# Patient Record
Sex: Female | Born: 1970 | Race: Black or African American | Hispanic: No | Smoking: Never smoker
Health system: Southern US, Community
[De-identification: ages and names within clinical notes are randomized; demographics above are authoritative.]

## PROBLEM LIST (undated history)

## (undated) DIAGNOSIS — I10 Essential (primary) hypertension: Secondary | ICD-10-CM

## (undated) DIAGNOSIS — K219 Gastro-esophageal reflux disease without esophagitis: Secondary | ICD-10-CM

## (undated) DIAGNOSIS — E785 Hyperlipidemia, unspecified: Secondary | ICD-10-CM

## (undated) HISTORY — DX: Gastro-esophageal reflux disease without esophagitis: K21.9

## (undated) HISTORY — DX: Essential (primary) hypertension: I10

## (undated) HISTORY — DX: Hyperlipidemia, unspecified: E78.5

## (undated) HISTORY — PX: BREAST BIOPSY: SHX20

---

## 1997-09-15 ENCOUNTER — Encounter: Admission: RE | Admit: 1997-09-15 | Discharge: 1997-09-15 | Payer: Self-pay | Admitting: Family Medicine

## 1997-09-24 ENCOUNTER — Ambulatory Visit (HOSPITAL_COMMUNITY): Admission: RE | Admit: 1997-09-24 | Discharge: 1997-09-24 | Payer: Self-pay | Admitting: Obstetrics & Gynecology

## 1997-09-24 ENCOUNTER — Encounter: Admission: RE | Admit: 1997-09-24 | Discharge: 1997-09-24 | Payer: Self-pay | Admitting: Family Medicine

## 1997-09-24 ENCOUNTER — Other Ambulatory Visit: Admission: RE | Admit: 1997-09-24 | Discharge: 1997-09-24 | Payer: Self-pay

## 1997-10-06 ENCOUNTER — Encounter: Admission: RE | Admit: 1997-10-06 | Discharge: 1997-10-06 | Payer: Self-pay | Admitting: Sports Medicine

## 1997-10-16 ENCOUNTER — Encounter: Admission: RE | Admit: 1997-10-16 | Discharge: 1997-10-16 | Payer: Self-pay | Admitting: Family Medicine

## 1997-11-03 ENCOUNTER — Encounter: Admission: RE | Admit: 1997-11-03 | Discharge: 1997-11-03 | Payer: Self-pay | Admitting: Family Medicine

## 1997-11-24 ENCOUNTER — Ambulatory Visit (HOSPITAL_COMMUNITY): Admission: RE | Admit: 1997-11-24 | Discharge: 1997-11-24 | Payer: Self-pay | Admitting: Family Medicine

## 1997-11-24 ENCOUNTER — Encounter: Admission: RE | Admit: 1997-11-24 | Discharge: 1997-11-24 | Payer: Self-pay | Admitting: Family Medicine

## 1997-12-08 ENCOUNTER — Encounter: Admission: RE | Admit: 1997-12-08 | Discharge: 1997-12-08 | Payer: Self-pay | Admitting: Sports Medicine

## 1997-12-23 ENCOUNTER — Ambulatory Visit (HOSPITAL_COMMUNITY): Admission: RE | Admit: 1997-12-23 | Discharge: 1997-12-23 | Payer: Self-pay | Admitting: *Deleted

## 1997-12-28 ENCOUNTER — Encounter: Admission: RE | Admit: 1997-12-28 | Discharge: 1997-12-28 | Payer: Self-pay | Admitting: Family Medicine

## 1998-01-11 ENCOUNTER — Encounter: Admission: RE | Admit: 1998-01-11 | Discharge: 1998-01-11 | Payer: Self-pay | Admitting: Family Medicine

## 1998-01-13 ENCOUNTER — Encounter: Payer: Self-pay | Admitting: *Deleted

## 1998-01-13 ENCOUNTER — Ambulatory Visit (HOSPITAL_COMMUNITY): Admission: RE | Admit: 1998-01-13 | Discharge: 1998-01-13 | Payer: Self-pay | Admitting: *Deleted

## 1998-01-14 ENCOUNTER — Encounter: Admission: RE | Admit: 1998-01-14 | Discharge: 1998-01-14 | Payer: Self-pay | Admitting: Family Medicine

## 1998-01-25 ENCOUNTER — Encounter: Admission: RE | Admit: 1998-01-25 | Discharge: 1998-01-25 | Payer: Self-pay | Admitting: Family Medicine

## 1998-02-08 ENCOUNTER — Encounter: Admission: RE | Admit: 1998-02-08 | Discharge: 1998-02-08 | Payer: Self-pay | Admitting: Family Medicine

## 1998-02-18 ENCOUNTER — Inpatient Hospital Stay (HOSPITAL_COMMUNITY): Admission: AD | Admit: 1998-02-18 | Discharge: 1998-02-18 | Payer: Self-pay | Admitting: *Deleted

## 1998-02-23 ENCOUNTER — Ambulatory Visit (HOSPITAL_COMMUNITY): Admission: RE | Admit: 1998-02-23 | Discharge: 1998-02-23 | Payer: Self-pay

## 1998-02-23 ENCOUNTER — Encounter: Admission: RE | Admit: 1998-02-23 | Discharge: 1998-02-23 | Payer: Self-pay | Admitting: Family Medicine

## 1998-03-01 ENCOUNTER — Encounter: Admission: RE | Admit: 1998-03-01 | Discharge: 1998-03-01 | Payer: Self-pay | Admitting: Family Medicine

## 1998-03-15 ENCOUNTER — Encounter: Admission: RE | Admit: 1998-03-15 | Discharge: 1998-03-15 | Payer: Self-pay | Admitting: Family Medicine

## 1998-03-20 ENCOUNTER — Inpatient Hospital Stay (HOSPITAL_COMMUNITY): Admission: AD | Admit: 1998-03-20 | Discharge: 1998-03-22 | Payer: Self-pay | Admitting: Family Medicine

## 1998-03-23 ENCOUNTER — Encounter: Admission: RE | Admit: 1998-03-23 | Discharge: 1998-03-23 | Payer: Self-pay | Admitting: Sports Medicine

## 1998-04-13 ENCOUNTER — Encounter: Admission: RE | Admit: 1998-04-13 | Discharge: 1998-04-13 | Payer: Self-pay | Admitting: Family Medicine

## 1998-07-29 ENCOUNTER — Encounter: Admission: RE | Admit: 1998-07-29 | Discharge: 1998-07-29 | Payer: Self-pay | Admitting: Family Medicine

## 1998-10-12 ENCOUNTER — Encounter: Admission: RE | Admit: 1998-10-12 | Discharge: 1998-10-12 | Payer: Self-pay | Admitting: Sports Medicine

## 1998-11-10 ENCOUNTER — Encounter: Admission: RE | Admit: 1998-11-10 | Discharge: 1998-11-10 | Payer: Self-pay | Admitting: Family Medicine

## 1999-03-22 ENCOUNTER — Encounter: Admission: RE | Admit: 1999-03-22 | Discharge: 1999-03-22 | Payer: Self-pay | Admitting: Family Medicine

## 1999-03-22 ENCOUNTER — Other Ambulatory Visit: Admission: RE | Admit: 1999-03-22 | Discharge: 1999-03-22 | Payer: Self-pay | Admitting: Family Medicine

## 1999-03-29 ENCOUNTER — Encounter: Admission: RE | Admit: 1999-03-29 | Discharge: 1999-03-29 | Payer: Self-pay | Admitting: Sports Medicine

## 1999-06-23 ENCOUNTER — Encounter: Admission: RE | Admit: 1999-06-23 | Discharge: 1999-06-23 | Payer: Self-pay | Admitting: Family Medicine

## 1999-08-15 ENCOUNTER — Encounter: Admission: RE | Admit: 1999-08-15 | Discharge: 1999-08-15 | Payer: Self-pay | Admitting: Family Medicine

## 1999-08-16 ENCOUNTER — Other Ambulatory Visit: Admission: RE | Admit: 1999-08-16 | Discharge: 1999-08-16 | Payer: Self-pay | Admitting: Family Medicine

## 1999-08-16 ENCOUNTER — Encounter: Admission: RE | Admit: 1999-08-16 | Discharge: 1999-08-16 | Payer: Self-pay | Admitting: Family Medicine

## 1999-08-30 ENCOUNTER — Encounter: Admission: RE | Admit: 1999-08-30 | Discharge: 1999-08-30 | Payer: Self-pay | Admitting: Family Medicine

## 1999-09-13 ENCOUNTER — Encounter: Admission: RE | Admit: 1999-09-13 | Discharge: 1999-09-13 | Payer: Self-pay | Admitting: Family Medicine

## 1999-12-13 ENCOUNTER — Encounter: Admission: RE | Admit: 1999-12-13 | Discharge: 1999-12-13 | Payer: Self-pay | Admitting: Family Medicine

## 2000-03-06 ENCOUNTER — Encounter: Admission: RE | Admit: 2000-03-06 | Discharge: 2000-03-06 | Payer: Self-pay | Admitting: Family Medicine

## 2000-03-22 ENCOUNTER — Encounter: Admission: RE | Admit: 2000-03-22 | Discharge: 2000-03-22 | Payer: Self-pay | Admitting: Family Medicine

## 2000-09-13 ENCOUNTER — Encounter: Admission: RE | Admit: 2000-09-13 | Discharge: 2000-09-13 | Payer: Self-pay | Admitting: Family Medicine

## 2000-10-09 ENCOUNTER — Encounter: Admission: RE | Admit: 2000-10-09 | Discharge: 2000-10-09 | Payer: Self-pay | Admitting: Family Medicine

## 2001-04-02 ENCOUNTER — Encounter: Admission: RE | Admit: 2001-04-02 | Discharge: 2001-04-02 | Payer: Self-pay | Admitting: Family Medicine

## 2001-04-09 ENCOUNTER — Encounter: Admission: RE | Admit: 2001-04-09 | Discharge: 2001-04-09 | Payer: Self-pay | Admitting: Sports Medicine

## 2001-05-21 ENCOUNTER — Ambulatory Visit (HOSPITAL_COMMUNITY): Admission: RE | Admit: 2001-05-21 | Discharge: 2001-05-21 | Payer: Self-pay | Admitting: Family Medicine

## 2001-05-21 ENCOUNTER — Encounter: Admission: RE | Admit: 2001-05-21 | Discharge: 2001-05-21 | Payer: Self-pay | Admitting: Family Medicine

## 2001-06-18 ENCOUNTER — Encounter: Admission: RE | Admit: 2001-06-18 | Discharge: 2001-06-18 | Payer: Self-pay | Admitting: Sports Medicine

## 2001-06-21 ENCOUNTER — Encounter: Payer: Self-pay | Admitting: Family Medicine

## 2001-06-21 ENCOUNTER — Encounter: Admission: RE | Admit: 2001-06-21 | Discharge: 2001-06-21 | Payer: Self-pay | Admitting: Family Medicine

## 2001-07-04 ENCOUNTER — Encounter: Admission: RE | Admit: 2001-07-04 | Discharge: 2001-07-04 | Payer: Self-pay | Admitting: Family Medicine

## 2001-08-20 ENCOUNTER — Encounter: Admission: RE | Admit: 2001-08-20 | Discharge: 2001-08-20 | Payer: Self-pay | Admitting: Family Medicine

## 2001-12-23 ENCOUNTER — Encounter: Admission: RE | Admit: 2001-12-23 | Discharge: 2001-12-23 | Payer: Self-pay | Admitting: Family Medicine

## 2002-01-02 ENCOUNTER — Encounter: Admission: RE | Admit: 2002-01-02 | Discharge: 2002-01-02 | Payer: Self-pay | Admitting: Family Medicine

## 2002-01-23 ENCOUNTER — Ambulatory Visit (HOSPITAL_COMMUNITY): Admission: RE | Admit: 2002-01-23 | Discharge: 2002-01-23 | Payer: Self-pay | Admitting: Family Medicine

## 2002-01-23 ENCOUNTER — Encounter: Payer: Self-pay | Admitting: Cardiology

## 2002-02-04 ENCOUNTER — Encounter: Admission: RE | Admit: 2002-02-04 | Discharge: 2002-02-04 | Payer: Self-pay | Admitting: Family Medicine

## 2002-05-01 ENCOUNTER — Encounter (INDEPENDENT_AMBULATORY_CARE_PROVIDER_SITE_OTHER): Payer: Self-pay

## 2002-05-01 ENCOUNTER — Encounter: Admission: RE | Admit: 2002-05-01 | Discharge: 2002-05-01 | Payer: Self-pay | Admitting: Family Medicine

## 2002-08-03 ENCOUNTER — Emergency Department (HOSPITAL_COMMUNITY): Admission: EM | Admit: 2002-08-03 | Discharge: 2002-08-03 | Payer: Self-pay | Admitting: Emergency Medicine

## 2002-08-03 ENCOUNTER — Encounter: Payer: Self-pay | Admitting: Emergency Medicine

## 2002-09-30 ENCOUNTER — Encounter: Admission: RE | Admit: 2002-09-30 | Discharge: 2002-09-30 | Payer: Self-pay | Admitting: Family Medicine

## 2002-10-23 ENCOUNTER — Encounter: Admission: RE | Admit: 2002-10-23 | Discharge: 2002-10-23 | Payer: Self-pay | Admitting: Sports Medicine

## 2002-11-05 ENCOUNTER — Encounter: Admission: RE | Admit: 2002-11-05 | Discharge: 2002-11-05 | Payer: Self-pay | Admitting: Family Medicine

## 2002-12-09 ENCOUNTER — Encounter: Admission: RE | Admit: 2002-12-09 | Discharge: 2002-12-09 | Payer: Self-pay | Admitting: Family Medicine

## 2002-12-26 ENCOUNTER — Inpatient Hospital Stay (HOSPITAL_COMMUNITY): Admission: AD | Admit: 2002-12-26 | Discharge: 2002-12-27 | Payer: Self-pay | Admitting: Obstetrics & Gynecology

## 2003-01-01 ENCOUNTER — Encounter: Admission: RE | Admit: 2003-01-01 | Discharge: 2003-01-01 | Payer: Self-pay | Admitting: Family Medicine

## 2003-01-01 ENCOUNTER — Ambulatory Visit (HOSPITAL_COMMUNITY): Admission: RE | Admit: 2003-01-01 | Discharge: 2003-01-01 | Payer: Self-pay | Admitting: Family Medicine

## 2003-01-29 ENCOUNTER — Encounter: Admission: RE | Admit: 2003-01-29 | Discharge: 2003-01-29 | Payer: Self-pay | Admitting: Family Medicine

## 2003-03-02 ENCOUNTER — Encounter: Admission: RE | Admit: 2003-03-02 | Discharge: 2003-03-02 | Payer: Self-pay | Admitting: Family Medicine

## 2003-03-02 ENCOUNTER — Inpatient Hospital Stay (HOSPITAL_COMMUNITY): Admission: RE | Admit: 2003-03-02 | Discharge: 2003-03-02 | Payer: Self-pay | Admitting: Family Medicine

## 2003-03-09 ENCOUNTER — Encounter: Admission: RE | Admit: 2003-03-09 | Discharge: 2003-03-09 | Payer: Self-pay | Admitting: Family Medicine

## 2003-03-25 ENCOUNTER — Encounter: Admission: RE | Admit: 2003-03-25 | Discharge: 2003-03-25 | Payer: Self-pay | Admitting: Family Medicine

## 2003-04-08 ENCOUNTER — Encounter: Admission: RE | Admit: 2003-04-08 | Discharge: 2003-04-08 | Payer: Self-pay | Admitting: Family Medicine

## 2003-04-10 ENCOUNTER — Inpatient Hospital Stay (HOSPITAL_COMMUNITY): Admission: AD | Admit: 2003-04-10 | Discharge: 2003-04-10 | Payer: Self-pay | Admitting: *Deleted

## 2003-04-23 ENCOUNTER — Encounter: Admission: RE | Admit: 2003-04-23 | Discharge: 2003-04-23 | Payer: Self-pay | Admitting: Family Medicine

## 2003-05-12 ENCOUNTER — Encounter: Admission: RE | Admit: 2003-05-12 | Discharge: 2003-05-12 | Payer: Self-pay | Admitting: Family Medicine

## 2003-05-18 ENCOUNTER — Encounter: Admission: RE | Admit: 2003-05-18 | Discharge: 2003-05-18 | Payer: Self-pay | Admitting: Family Medicine

## 2003-05-25 ENCOUNTER — Observation Stay (HOSPITAL_COMMUNITY): Admission: AD | Admit: 2003-05-25 | Discharge: 2003-05-25 | Payer: Self-pay | Admitting: *Deleted

## 2003-05-28 ENCOUNTER — Encounter: Admission: RE | Admit: 2003-05-28 | Discharge: 2003-05-28 | Payer: Self-pay | Admitting: Family Medicine

## 2003-06-03 ENCOUNTER — Inpatient Hospital Stay (HOSPITAL_COMMUNITY): Admission: AD | Admit: 2003-06-03 | Discharge: 2003-06-05 | Payer: Self-pay | Admitting: *Deleted

## 2003-06-04 ENCOUNTER — Encounter (INDEPENDENT_AMBULATORY_CARE_PROVIDER_SITE_OTHER): Payer: Self-pay | Admitting: Specialist

## 2003-06-06 ENCOUNTER — Encounter: Admission: RE | Admit: 2003-06-06 | Discharge: 2003-07-06 | Payer: Self-pay | Admitting: Family Medicine

## 2003-06-09 ENCOUNTER — Encounter: Admission: RE | Admit: 2003-06-09 | Discharge: 2003-06-09 | Payer: Self-pay | Admitting: Family Medicine

## 2003-06-11 ENCOUNTER — Encounter: Admission: RE | Admit: 2003-06-11 | Discharge: 2003-06-11 | Payer: Self-pay | Admitting: Sports Medicine

## 2003-07-15 ENCOUNTER — Encounter (INDEPENDENT_AMBULATORY_CARE_PROVIDER_SITE_OTHER): Payer: Self-pay | Admitting: *Deleted

## 2003-07-15 LAB — CONVERTED CEMR LAB

## 2003-07-17 ENCOUNTER — Encounter: Admission: RE | Admit: 2003-07-17 | Discharge: 2003-07-17 | Payer: Self-pay | Admitting: Sports Medicine

## 2003-07-23 ENCOUNTER — Encounter: Admission: RE | Admit: 2003-07-23 | Discharge: 2003-07-23 | Payer: Self-pay | Admitting: Family Medicine

## 2003-07-23 ENCOUNTER — Encounter (INDEPENDENT_AMBULATORY_CARE_PROVIDER_SITE_OTHER): Payer: Self-pay | Admitting: Specialist

## 2004-02-02 ENCOUNTER — Ambulatory Visit: Payer: Self-pay | Admitting: Family Medicine

## 2004-06-23 ENCOUNTER — Ambulatory Visit: Payer: Self-pay | Admitting: Family Medicine

## 2004-06-23 ENCOUNTER — Encounter (INDEPENDENT_AMBULATORY_CARE_PROVIDER_SITE_OTHER): Payer: Self-pay | Admitting: Specialist

## 2004-10-06 ENCOUNTER — Encounter: Admission: RE | Admit: 2004-10-06 | Discharge: 2004-10-06 | Payer: Self-pay | Admitting: Sports Medicine

## 2004-10-06 ENCOUNTER — Ambulatory Visit: Payer: Self-pay | Admitting: Sports Medicine

## 2004-10-20 ENCOUNTER — Ambulatory Visit: Payer: Self-pay

## 2006-03-06 ENCOUNTER — Ambulatory Visit: Payer: Self-pay | Admitting: Family Medicine

## 2006-04-12 DIAGNOSIS — R55 Syncope and collapse: Secondary | ICD-10-CM | POA: Insufficient documentation

## 2006-04-12 DIAGNOSIS — H9190 Unspecified hearing loss, unspecified ear: Secondary | ICD-10-CM | POA: Insufficient documentation

## 2006-04-13 ENCOUNTER — Encounter (INDEPENDENT_AMBULATORY_CARE_PROVIDER_SITE_OTHER): Payer: Self-pay | Admitting: *Deleted

## 2006-09-11 ENCOUNTER — Emergency Department (HOSPITAL_COMMUNITY): Admission: EM | Admit: 2006-09-11 | Discharge: 2006-09-11 | Payer: Self-pay | Admitting: Emergency Medicine

## 2006-11-15 ENCOUNTER — Encounter: Payer: Self-pay | Admitting: Family Medicine

## 2006-11-15 ENCOUNTER — Ambulatory Visit: Payer: Self-pay | Admitting: Family Medicine

## 2006-11-15 DIAGNOSIS — D649 Anemia, unspecified: Secondary | ICD-10-CM

## 2006-11-15 LAB — CONVERTED CEMR LAB
HCT: 36.3 % (ref 36.0–46.0)
Hemoglobin: 11.6 g/dL — ABNORMAL LOW (ref 12.0–15.0)
WBC: 4.3 10*3/uL (ref 4.0–10.5)

## 2006-11-16 ENCOUNTER — Encounter: Payer: Self-pay | Admitting: Family Medicine

## 2006-11-21 ENCOUNTER — Encounter: Payer: Self-pay | Admitting: Family Medicine

## 2006-11-21 LAB — CONVERTED CEMR LAB: Pap Smear: NORMAL

## 2007-03-21 ENCOUNTER — Ambulatory Visit: Payer: Self-pay | Admitting: Family Medicine

## 2007-03-21 DIAGNOSIS — F329 Major depressive disorder, single episode, unspecified: Secondary | ICD-10-CM | POA: Insufficient documentation

## 2007-03-22 LAB — CONVERTED CEMR LAB
HCT: 37.6 % (ref 36.0–46.0)
Hemoglobin: 11.7 g/dL — ABNORMAL LOW (ref 12.0–15.0)
MCHC: 31.1 g/dL (ref 30.0–36.0)
MCV: 84.7 fL (ref 78.0–100.0)
RDW: 13.4 % (ref 11.5–15.5)

## 2007-03-25 ENCOUNTER — Encounter: Payer: Self-pay | Admitting: Family Medicine

## 2007-09-26 ENCOUNTER — Ambulatory Visit: Payer: Self-pay | Admitting: Family Medicine

## 2007-09-26 DIAGNOSIS — N760 Acute vaginitis: Secondary | ICD-10-CM | POA: Insufficient documentation

## 2007-09-26 LAB — CONVERTED CEMR LAB: GC Probe Amp, Genital: NEGATIVE

## 2007-09-27 ENCOUNTER — Encounter: Payer: Self-pay | Admitting: Family Medicine

## 2008-06-18 ENCOUNTER — Ambulatory Visit: Payer: Self-pay | Admitting: Family Medicine

## 2008-06-18 ENCOUNTER — Encounter: Payer: Self-pay | Admitting: Family Medicine

## 2008-06-18 DIAGNOSIS — R87610 Atypical squamous cells of undetermined significance on cytologic smear of cervix (ASC-US): Secondary | ICD-10-CM | POA: Insufficient documentation

## 2008-06-18 LAB — CONVERTED CEMR LAB
Chlamydia, DNA Probe: NEGATIVE
GC Probe Amp, Genital: NEGATIVE
MCHC: 32 g/dL (ref 30.0–36.0)
RBC: 4.21 M/uL (ref 3.87–5.11)
WBC: 5.2 10*3/uL (ref 4.0–10.5)

## 2008-07-07 ENCOUNTER — Encounter: Payer: Self-pay | Admitting: Family Medicine

## 2008-07-14 ENCOUNTER — Encounter: Payer: Self-pay | Admitting: Family Medicine

## 2008-07-14 ENCOUNTER — Ambulatory Visit: Payer: Self-pay | Admitting: Family Medicine

## 2010-07-01 NOTE — Op Note (Signed)
NAMEDOREATHER, HOXWORTH                              ACCOUNT NO.:  [id number]   MEDICAL RECORD NO.:  [id number]                   PATIENT TYPE:  INP   LOCATION:  9109                                 FACILITY:  [facility]   PHYSICIAN:  Lesly Dukes, M.D.              DATE OF BIRTH:  [birth year]   DATE OF PROCEDURE:  06/04/2003  DATE OF DISCHARGE:                                 OPERATIVE REPORT   PREOPERATIVE DIAGNOSIS:  A 40 year old para 3 female desiring permanent  sterilization.   POSTOPERATIVE DIAGNOSIS:  A 40 year old para 3 female desiring permanent  sterilization.   PROCEDURE:  Postpartum BTL via the Pomeroy method.   SURGEON:  Dr. Elsie Lincoln.  Anesthesia  Epidural.   COMPLICATIONS:  None.   PATHOLOGY:  Bilateral fallopian tube.   DESCRIPTION OF PROCEDURE:  After informed consent was obtained via the  hospital's bilingual interpreter the patient was taken to the operating room  where epidural anesthesia was found to be adequate.  The patient was placed  in the dorsal supine position and prepared in the normal sterile fashion.  The bladder was in-and-out catheterized with a red Roxan Hockey.  An  infraumbilical skin incision was then made with the scalpel and carried down  to the layer fascia bluntly.  The fascia was elevated and entered sharply  with the knife.  This incision was then extended bilaterally bluntly.  Retractors were placed into the abdomen and the peritoneum was identified,  tented up, and entered sharply with the knife.  The intraperitoneal cavity  was then safely entered, the retractors placed inside the peritoneum.  The  right fallopian tube was then identified and followed out to its fimbriated  end.  It was tented up in the middle portion, doubly ligated with 0 plain,  and then transected.  The fallopian tube was noted to be hemostatic.  The  fallopian tube was returned to the abdomen.  The left fallopian tube was  then identified and followed up to its  fimbriated end.  The middle portion  was tented up with a Babcock, doubly ligated with 0 plain, and transected  with the Metzenbaum scissors and good hemostasis was noted from the left  fallopian tube.  The left fallopian tube was then left returned to the  abdomen.  The fascia was then closed with 0 Vicryl in a running fashion.  The skin was closed with 4-0 Vicryl in a subcuticular fashion.  The patient  tolerated the procedure well.  Sponge, lap, instrument, and needle counts  were correct x2 and the patient went to the recovery room in stable  condition.                                               Fredrich Romans.  Penne Lash, M.D.    Lora Paula  D:  06/04/2003  T:  06/05/2003  Job:  [id number]

## 2010-07-01 NOTE — Discharge Summary (Signed)
[name], [name]                              ACCOUNT NO.:  [id number]   MEDICAL RECORD NO.:  [id number]                   PATIENT TYPE:  INP   LOCATION:  9167                                 FACILITY:  [facility]   PHYSICIAN:  Conni Elliot, M.D.             DATE OF BIRTH:  May 31, 1970   DATE OF ADMISSION:  05/25/2003  DATE OF DISCHARGE:  05/25/2003                                 DISCHARGE SUMMARY   DISCHARGE DIAGNOSIS:  False labor of a term pregnancy.   PROCEDURES:  None.   HOSPITAL COURSE:  Susan Ayers is a 40 year old, G3, P2-0-0-2, who was admitted  on the morning of May 25, 2003 with presumed labor.  The patient had been  having contractions since about 11:00 that night every 4 minutes.  Of note,  the patient is deaf and required a sign Presenter, broadcasting for all  communication.  The patient had no rupture of membranes.  Initial cervical  exam revealed the cervix at 1 cm, 25% effaced, with a high station; however,  the patient was contracting every 4 minutes with approximately 1 minute  duration with moderate intensity.  The patient initially chose to walk for  about an hour, and then returned to maternity admissions.  Upon recheck, the  patient was approximately 2 cm dilated, but again high station.  The patient  was admitted labor and delivery for possible labor.  However, the patient  did not progress, and eventually the contractions spaced out.  The patient  was not given Pitocin, as there was no indication for induction at that  time, and the patient was only 38 weeks and 1 day estimated gestational age.  In addition, the patient was informed that a translator could not be  provided 24 hours a day for assistance with communication.  Thus, at that  time, the patient decided to head home.  Thus, the patient was discharged  home later that night.   LABORATORY DATA:  White blood cell count 7.3, hemoglobin 10.5, hematocrit  32.4, platelets 255, RPR nonreactive.   DISCHARGE  MEDICATIONS:  1. Prenatal vitamins.  2. Ambien 10 mg p.o. q.h.s. p.r.n. insomnia.   DISCHARGE INSTRUCTIONS:  The patient was provided with routine labor  instructions.   FOLLOW UP APPOINTMENTS:  The patient was instructed to keep all follow up  appointments for prenatal care with her primary care Susan Ayers.     Franchot Mimes, MD                         Conni Elliot, M.D.    TV/MEDQ  D:  07/07/2003  T:  07/08/2003  Job:  [id number]

## 2015-08-11 ENCOUNTER — Emergency Department (HOSPITAL_COMMUNITY): Payer: Medicare Other

## 2015-08-11 ENCOUNTER — Encounter (HOSPITAL_COMMUNITY): Payer: Self-pay

## 2015-08-11 ENCOUNTER — Emergency Department (HOSPITAL_COMMUNITY)
Admission: EM | Admit: 2015-08-11 | Discharge: 2015-08-11 | Disposition: A | Discharge status: Home / Self Care | Payer: Medicare Other | Attending: Emergency Medicine | Admitting: Emergency Medicine

## 2015-08-11 DIAGNOSIS — R791 Abnormal coagulation profile: Secondary | ICD-10-CM | POA: Insufficient documentation

## 2015-08-11 DIAGNOSIS — K219 Gastro-esophageal reflux disease without esophagitis: Secondary | ICD-10-CM | POA: Diagnosis not present

## 2015-08-11 DIAGNOSIS — R0602 Shortness of breath: Secondary | ICD-10-CM | POA: Diagnosis present

## 2015-08-11 LAB — COMPREHENSIVE METABOLIC PANEL
ALT: 24 U/L (ref 14–54)
AST: 22 U/L (ref 15–41)
Albumin: 3.9 g/dL (ref 3.5–5.0)
Alkaline Phosphatase: 81 U/L (ref 38–126)
Anion gap: 9 (ref 5–15)
BUN: 14 mg/dL (ref 6–20)
CHLORIDE: 105 mmol/L (ref 101–111)
CO2: 22 mmol/L (ref 22–32)
CREATININE: 1.08 mg/dL — AB (ref 0.44–1.00)
Calcium: 9.6 mg/dL (ref 8.9–10.3)
Glucose, Bld: 96 mg/dL (ref 65–99)
POTASSIUM: 3.6 mmol/L (ref 3.5–5.1)
Sodium: 136 mmol/L (ref 135–145)
Total Bilirubin: 0.4 mg/dL (ref 0.3–1.2)
Total Protein: 7.2 g/dL (ref 6.5–8.1)

## 2015-08-11 LAB — D-DIMER, QUANTITATIVE: D-Dimer, Quant: 0.67 ug/mL-FEU — ABNORMAL HIGH (ref 0.00–0.50)

## 2015-08-11 LAB — CBC
HCT: 35.8 % — ABNORMAL LOW (ref 36.0–46.0)
Hemoglobin: 11.6 g/dL — ABNORMAL LOW (ref 12.0–15.0)
MCH: 25.9 pg — AB (ref 26.0–34.0)
MCHC: 32.4 g/dL (ref 30.0–36.0)
MCV: 79.9 fL (ref 78.0–100.0)
PLATELETS: 320 10*3/uL (ref 150–400)
RBC: 4.48 MIL/uL (ref 3.87–5.11)
RDW: 13 % (ref 11.5–15.5)
WBC: 5.9 10*3/uL (ref 4.0–10.5)

## 2015-08-11 LAB — I-STAT TROPONIN, ED
TROPONIN I, POC: 0 ng/mL (ref 0.00–0.08)
Troponin i, poc: 0 ng/mL (ref 0.00–0.08)

## 2015-08-11 LAB — APTT: aPTT: 30 seconds (ref 24–37)

## 2015-08-11 MED ORDER — IOPAMIDOL (ISOVUE-370) INJECTION 76%
INTRAVENOUS | Status: AC
  Administered 2015-08-11: 55 mL
  Filled 2015-08-11: qty 100

## 2015-08-11 MED ORDER — ASPIRIN 81 MG PO CHEW
324.0000 mg | CHEWABLE_TABLET | Freq: Once | ORAL | Status: AC
  Administered 2015-08-11: 324 mg via ORAL
  Filled 2015-08-11: qty 4

## 2015-08-11 MED ORDER — SODIUM CHLORIDE 0.9 % IV SOLN
INTRAVENOUS | Status: DC
  Administered 2015-08-11: 11:00:00 via INTRAVENOUS

## 2015-08-11 MED ORDER — OMEPRAZOLE 20 MG PO CPDR: 20.0000 mg | DELAYED_RELEASE_CAPSULE | Freq: Every day | ORAL | Status: AC

## 2015-08-11 NOTE — ED Notes (Signed)
Interpreter was on the way and about 30 min out

## 2015-08-11 NOTE — ED Notes (Signed)
Pt. Coming from home c/o SOB that started today. Pt. Reports indigestion off an on for around a week. Pt. Took tums last night, but woke up this morning feeling really short of breath and chest tightening. Pt. Emailed PCP who advised her to come here. Pt. Reports overall feeling of not feeling well.

## 2015-08-11 NOTE — Discharge Instructions (Signed)
Gastroesophageal Reflux Disease, Adult Normally, food travels down the esophagus and stays in the stomach to be digested. If a person has gastroesophageal reflux disease (GERD), food and stomach acid move back up into the esophagus. When this happens, the esophagus becomes sore and swollen (inflamed). Over time, GERD can make small holes (ulcers) in the lining of the esophagus. HOME CARE Diet  Follow a diet as told by your doctor. You may need to avoid foods and drinks such as:  Coffee and tea (with or without caffeine).  Drinks that contain alcohol.  Energy drinks and sports drinks.  Carbonated drinks or sodas.  Chocolate and cocoa.  Peppermint and mint flavorings.  Garlic and onions.  Horseradish.  Spicy and acidic foods, such as peppers, chili powder, curry powder, vinegar, hot sauces, and BBQ sauce.  Citrus fruit juices and citrus fruits, such as oranges, lemons, and limes.  Tomato-based foods, such as red sauce, chili, salsa, and pizza with red sauce.  Fried and fatty foods, such as donuts, french fries, potato chips, and high-fat dressings.  High-fat meats, such as hot dogs, rib eye steak, sausage, ham, and bacon.  High-fat dairy items, such as whole milk, butter, and cream cheese.  Eat small meals often. Avoid eating large meals.  Avoid drinking large amounts of liquid with your meals.  Avoid eating meals during the 2-3 hours before bedtime.  Avoid lying down right after you eat.  Do not exercise right after you eat. General Instructions  Pay attention to any changes in your symptoms.  Take over-the-counter and prescription medicines only as told by your doctor. Do not take aspirin, ibuprofen, or other NSAIDs unless your doctor says it is okay.  Do not use any tobacco products, including cigarettes, chewing tobacco, and e-cigarettes. If you need help quitting, ask your doctor.  Wear loose clothes. Do not wear anything tight around your waist.  Raise  (elevate) the head of your bed about 6 inches (15 cm).  Try to lower your stress. If you need help doing this, ask your doctor.  If you are overweight, lose an amount of weight that is healthy for you. Ask your doctor about a safe weight loss goal.  Keep all follow-up visits as told by your doctor. This is important. GET HELP IF:  You have new symptoms.  You lose weight and you do not know why it is happening.  You have trouble swallowing, or it hurts to swallow.  You have wheezing or a cough that keeps happening.  Your symptoms do not get better with treatment.  You have a hoarse voice. GET HELP RIGHT AWAY IF:  You have pain in your arms, neck, jaw, teeth, or back.  You feel sweaty, dizzy, or light-headed.  You have chest pain or shortness of breath.  You throw up (vomit) and your throw up looks like blood or coffee grounds.  You pass out (faint).  Your poop (stool) is bloody or black.  You cannot swallow, drink, or eat.   This information is not intended to replace advice given to you by your health care provider. Make sure you discuss any questions you have with your health care provider.   Document Released: 07/19/2007 Document Revised: 10/21/2014 Document Reviewed: 05/27/2014 Elsevier Interactive Patient Education 2016 [employer]  -   

## 2015-08-11 NOTE — ED Provider Notes (Signed)
CSN: [id number]     Arrival date & time 08/11/15  [id number] History   First MD Initiated Contact with Patient 08/11/15 0915     Chief Complaint  Patient presents with  . Shortness of Breath   Sign language interpreter HPI Per nursing notes" Pt. Coming from home c/o SOB that started today. Pt. Reports indigestion off an on for around a week. Pt. Took tums last night, but woke up this morning feeling really short of breath and chest tightening. Pt. Emailed PCP who advised her to come here. Pt. Reports overall feeling of not feeling well"  This am it was worse.  She felt like she was having trouble breathing.  It was more severe than previously.  Right now the pain has resolved.  A little bit of a cough recently but not much.  No prior history of DVt, PE, or heart disease.  NO abdominal pain. History reviewed. No pertinent past medical history. History reviewed. No pertinent past surgical history. History reviewed. No pertinent family history. Social History  Substance Use Topics  . Smoking status: Never Smoker   . Smokeless tobacco: None  . Alcohol Use: No   OB History    No data available     Review of Systems  All other systems reviewed and are negative.     Allergies  Review of patient's allergies indicates no known allergies.  Home Medications   Prior to Admission medications   Medication Sig Start Date End Date Taking? Authorizing Provider  atorvastatin (LIPITOR) 10 MG tablet Take 10 mg by mouth daily. 05/09/15  Yes Historical Provider, MD  calcium carbonate (TUMS - DOSED IN MG ELEMENTAL CALCIUM) 500 MG chewable tablet Chew 1 tablet by mouth daily as needed for indigestion or heartburn.   Yes Historical Provider, MD  lisinopril-hydrochlorothiazide (PRINZIDE,ZESTORETIC) 20-12.5 MG tablet Take 1 tablet by mouth daily. 07/28/15  Yes Historical Provider, MD  naproxen sodium (ANAPROX) 220 MG tablet Take 220 mg by mouth 2 (two) times daily as needed (pain).   Yes Historical Provider,  MD  omeprazole (PRILOSEC) 20 MG capsule Take 1 capsule (20 mg total) by mouth daily. 08/11/15   Linwood DibblesJon Tanith Dagostino, MD   BP 127/75 mmHg  Pulse 92  Temp(Src) 98.7 F (37.1 C) (Oral)  Resp 15  SpO2 100%  LMP 08/11/2015 Physical Exam  Constitutional: She appears well-developed and well-nourished. No distress.  HENT:  Head: Normocephalic and atraumatic.  Right Ear: External ear normal.  Left Ear: External ear normal.  Eyes: Conjunctivae are normal. Right eye exhibits no discharge. Left eye exhibits no discharge. No scleral icterus.  Neck: Neck supple. No tracheal deviation present.  Cardiovascular: Normal rate, regular rhythm and intact distal pulses.   Pulmonary/Chest: Effort normal and breath sounds normal. No stridor. No respiratory distress. She has no wheezes. She has no rales.  Abdominal: Soft. Bowel sounds are normal. She exhibits no distension. There is no tenderness. There is no rebound and no guarding.  Musculoskeletal: She exhibits no edema or tenderness.  Neurological: She is alert. She has normal strength. No cranial nerve deficit (no facial droop, extraocular movements intact, no slurred speech) or sensory deficit. She exhibits normal muscle tone. She displays no seizure activity. Coordination normal.  Skin: Skin is warm and dry. No rash noted.  Psychiatric: She has a normal mood and affect.  Nursing note and vitals reviewed.   ED Course  Procedures (including critical care time) Labs Review Labs Reviewed  CBC - Abnormal; Notable for the following:  Hemoglobin 11.6 (*)    HCT 35.8 (*)    MCH 25.9 (*)    All other components within normal limits  COMPREHENSIVE METABOLIC PANEL - Abnormal; Notable for the following:    Creatinine, Ser 1.08 (*)    All other components within normal limits  D-DIMER, QUANTITATIVE (NOT AT [facility]) - Abnormal; Notable for the following:    D-Dimer, Quant 0.67 (*)    All other components within normal limits  APTT  I-STAT TROPOININ, ED  Rosezena SensorI-STAT  TROPOININ, ED    Imaging Review Dg Chest 2 View  08/11/2015  CLINICAL DATA:  Shortness of Breath yesterday, worsening today. Chest pain today. EXAM: CHEST  2 VIEW COMPARISON:  None. FINDINGS: The heart size and mediastinal contours are within normal limits. Both lungs are clear. The visualized skeletal structures are unremarkable. IMPRESSION: No active cardiopulmonary disease. Electronically Signed   By: Charlett NoseKevin  Dover M.D.   On: 08/11/2015 09:57   Ct Angio Chest Pe W/cm &/or Wo Cm  08/11/2015  CLINICAL DATA:  Chest pain and shortness of breath. Elevated D-dimer. EXAM: CT ANGIOGRAPHY CHEST WITH CONTRAST TECHNIQUE: Multidetector CT imaging of the chest was performed using the standard protocol during bolus administration of intravenous contrast. Multiplanar CT image reconstructions and MIPs were obtained to evaluate the vascular anatomy. CONTRAST:  55 cc Isovue 370 intravenous COMPARISON:  None. FINDINGS: Cardiovascular: No evidence of pulmonary embolism or acute aortic syndrome. Normal heart size. No pericardial effusion. Mediastinum:  Negative for adenopathy. Lungs/Pleura: There is no edema, consolidation, effusion, or pneumothorax. Upper abdomen: No acute findings. Sub cm presumed cyst in segment 2 of the liver. Musculoskeletal: No chest wall mass or suspicious bone lesions identified. Review of the MIP images confirms the above findings. IMPRESSION: Negative for pulmonary embolism or other acute finding. Electronically Signed   By: Marnee SpringJonathon  Watts M.D.   On: 08/11/2015 13:05   I have personally reviewed and evaluated these images and lab results as part of my medical decision-making.   EKG Interpretation   Date/Time:  Wednesday August 11 2015 08:46:09 EDT Ventricular Rate:  111 PR Interval:    QRS Duration: 82 QT Interval:  360 QTC Calculation: 490 R Axis:   40 Text Interpretation:   Sinus tachycardia Low voltage, precordial leads  Borderline T abnormalities, diffuse leads , new since last  tracing  Borderline prolonged QT interval , new since last tracing Since last  tracing rate faster Confirmed by Airiel Oblinger  MD-J, Opal Dinning ([id number]) on 08/11/2015  8:52:43 AM      MDM   Final diagnoses:  Gastroesophageal reflux disease, esophagitis presence not specified    Patient's symptoms are atypical for heart disease. Heart score of 2. I doubt that her symptoms are related to acute coronary syndrome  D-dimer was mildly elevated but the CT scan is negative for PE or other acute abnormality.  I suspect her symptoms are related to gastroesophageal reflux disease. LAD on discharge home with a prescription for Prilosec. Follow up with a primary care doctor.    Linwood DibblesJon Estevan Kersh, MD 08/11/15 1355

## 2015-08-11 NOTE — ED Notes (Signed)
Pt. Waiting for sign language interpretor to arrive.

## 2015-08-11 NOTE — ED Notes (Signed)
Patient transported to CT 

## 2015-08-11 NOTE — ED Notes (Signed)
Pt. Transported to xray at this time.  

## 2016-08-03 IMAGING — CT CT ANGIO CHEST
2 of 6 series · 18 of 36 positions shown · IV contrast (Omni 300)
Comparison: None.

CLINICAL DATA: Chest pain and shortness of breath. Elevated
D-dimer.

EXAM:
CT ANGIOGRAPHY CHEST WITH CONTRAST
TECHNIQUE: Multidetector CT imaging of the chest was performed using the
standard protocol during bolus administration of intravenous
contrast. Multiplanar CT image reconstructions and MIPs were
obtained to evaluate the vascular anatomy.
CONTRAST:  55 cc Isovue 370 intravenous

[Series 6: pe thins · axial · 0.70mm/px · z∈[+1215,+1465]mm · 17 of 282 slices shown]
[im 16/282  lung]
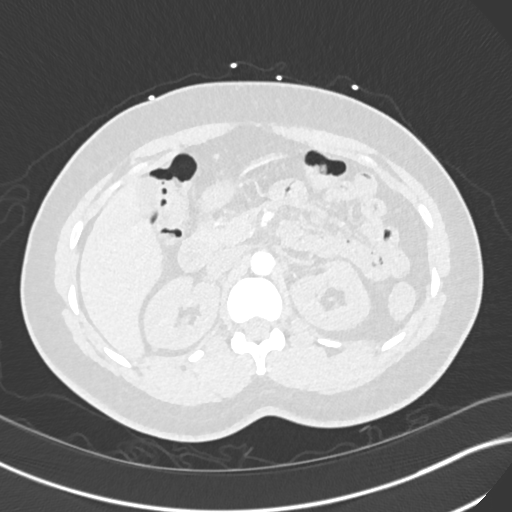
[im 32/282  mediastinal]
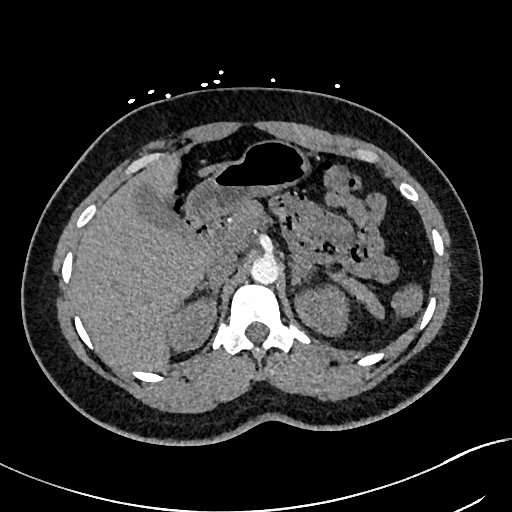
[im 47/282  lung]
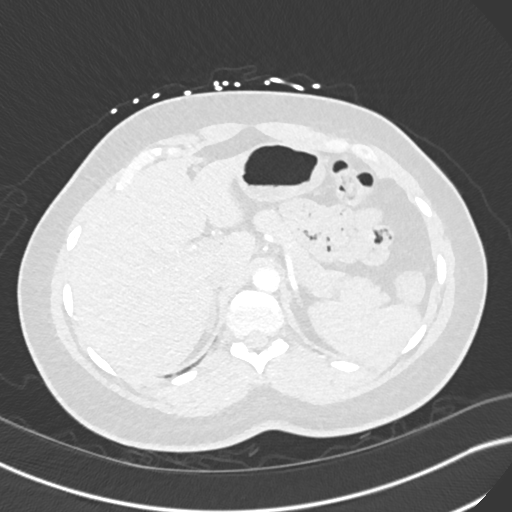
[im 63/282  mediastinal]
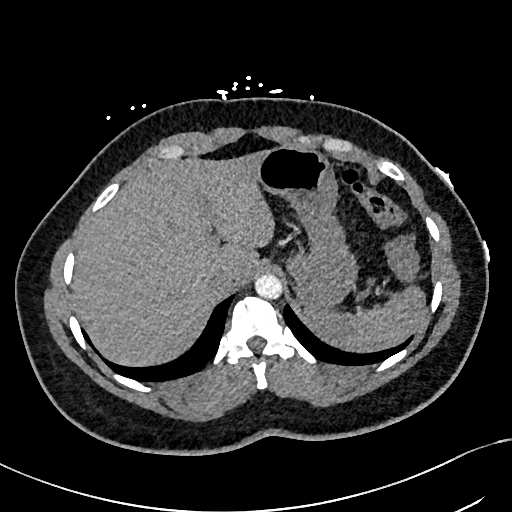
[im 79/282  lung]
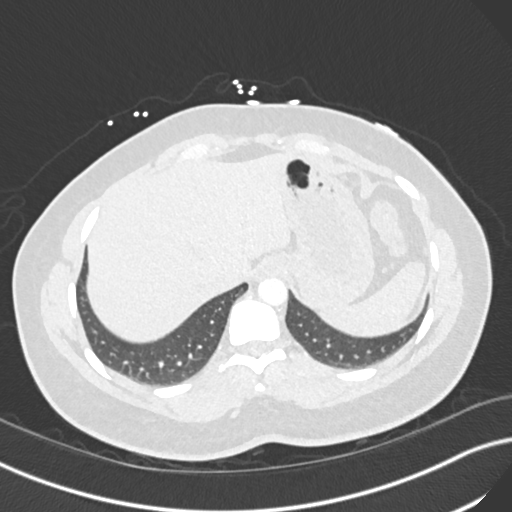
[im 94/282  mediastinal]
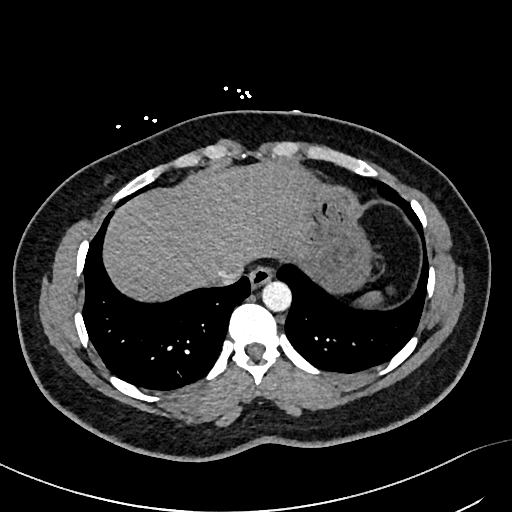
[im 110/282  lung]
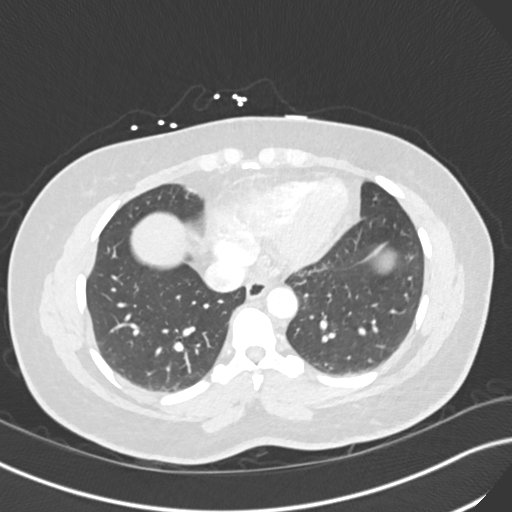
[im 125/282  mediastinal]
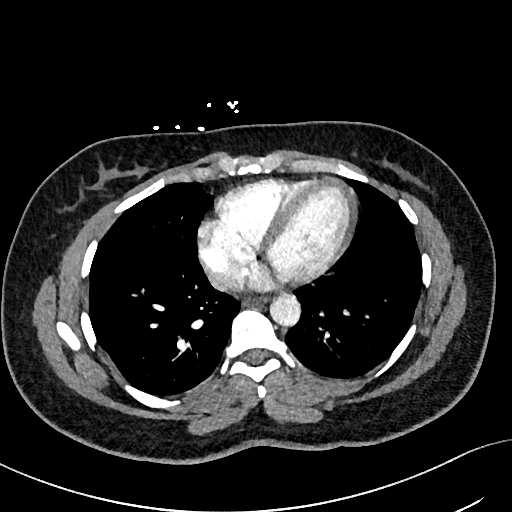
[im 141/282  lung]
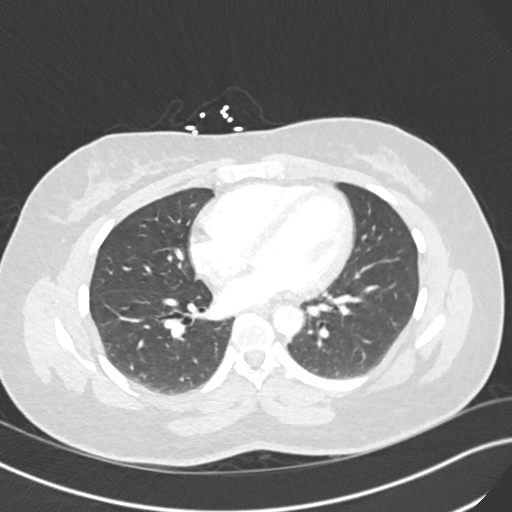
[im 157/282  mediastinal]
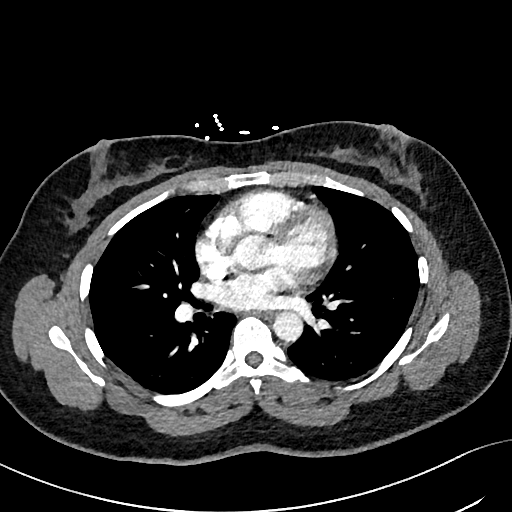
[im 172/282  lung]
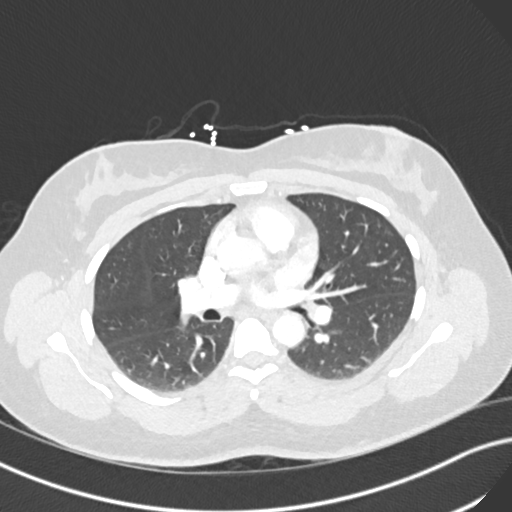
[im 188/282  mediastinal]
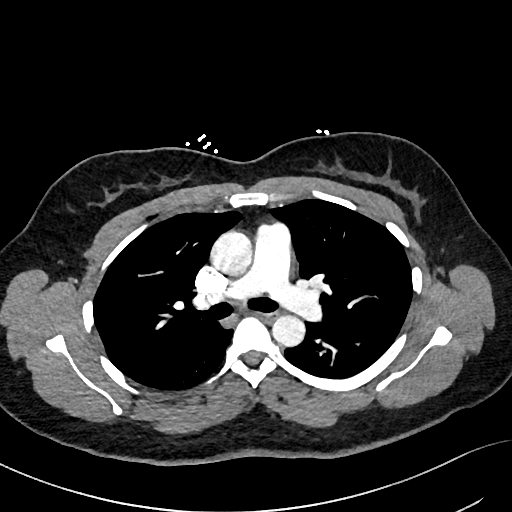
[im 203/282  lung]
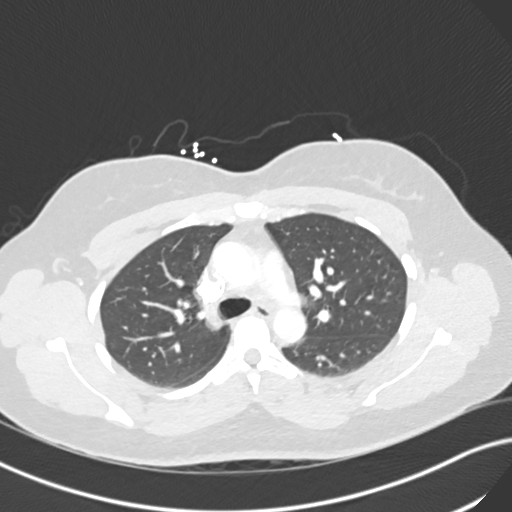
[im 219/282  mediastinal]
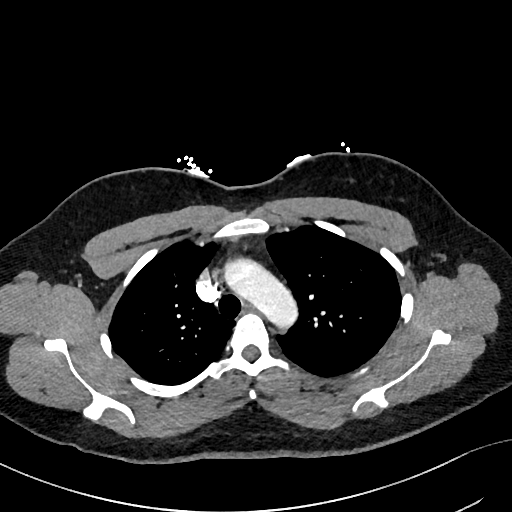
[im 235/282  lung]
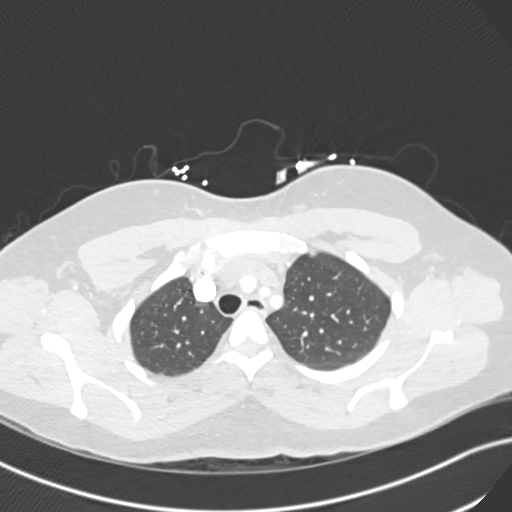
[im 250/282  mediastinal]
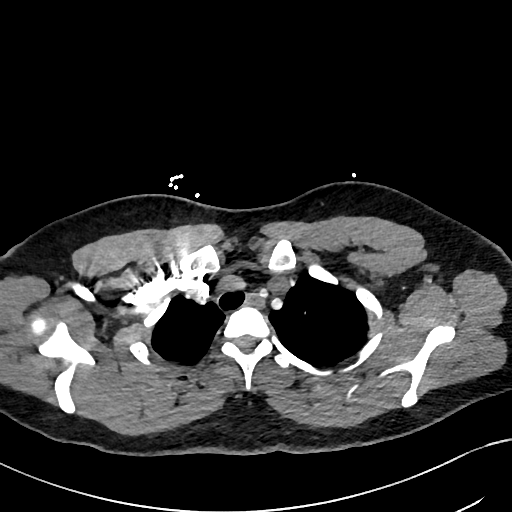
[im 266/282  lung]
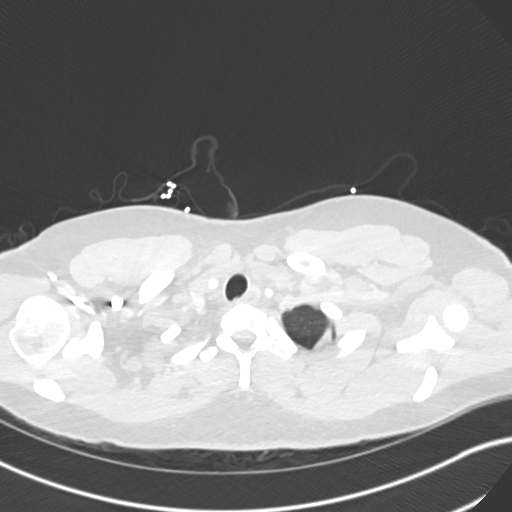

[Series 7: pe 2mm cor · coronal · 0.59mm/px · 1 of 129 slices shown]
[im 65/129  mediastinal]
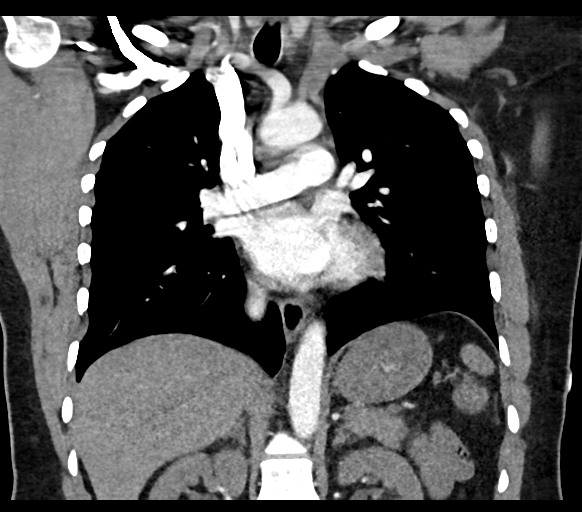

[18 of 36 positions shown; findings below may reference images not displayed]

FINDINGS: Cardiovascular: No evidence of pulmonary embolism or acute aortic
syndrome. Normal heart size. No pericardial effusion.

Mediastinum:  Negative for adenopathy.

Lungs/Pleura: There is no edema, consolidation, effusion, or
pneumothorax.

Upper abdomen: No acute findings. Sub cm presumed cyst in segment 2
of the liver.

Musculoskeletal: No chest wall mass or suspicious bone lesions
identified.

Review of the MIP images confirms the above findings.
IMPRESSION: Negative for pulmonary embolism or other acute finding.

## 2016-08-03 IMAGING — DX DG CHEST 2V
2 series · 2 of 2 positions shown · non-contrast
Comparison: None.

CLINICAL DATA: Shortness of Breath yesterday, worsening today.
Chest pain today.

EXAM:
CHEST  2 VIEW

[w chest pa]
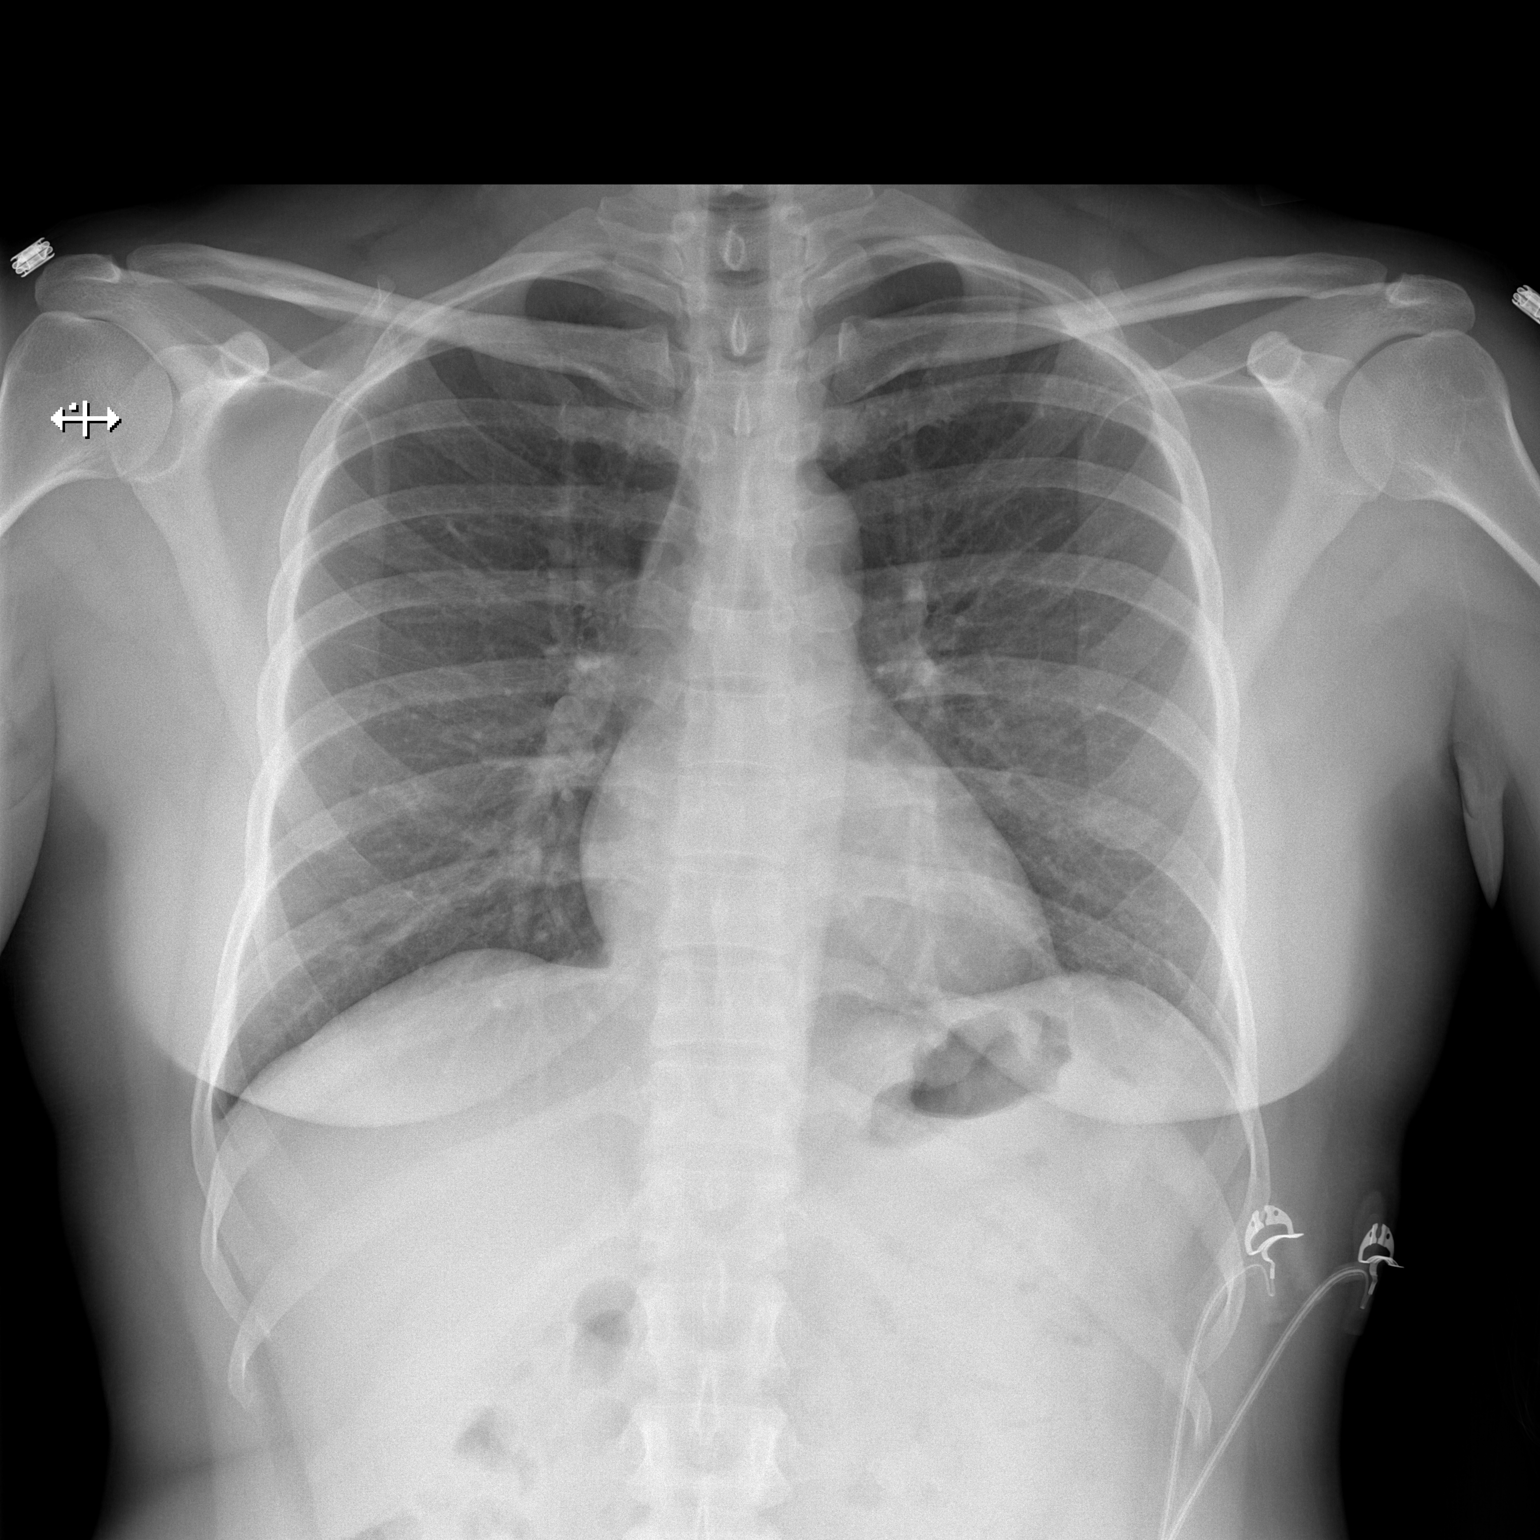

[w chest lat]
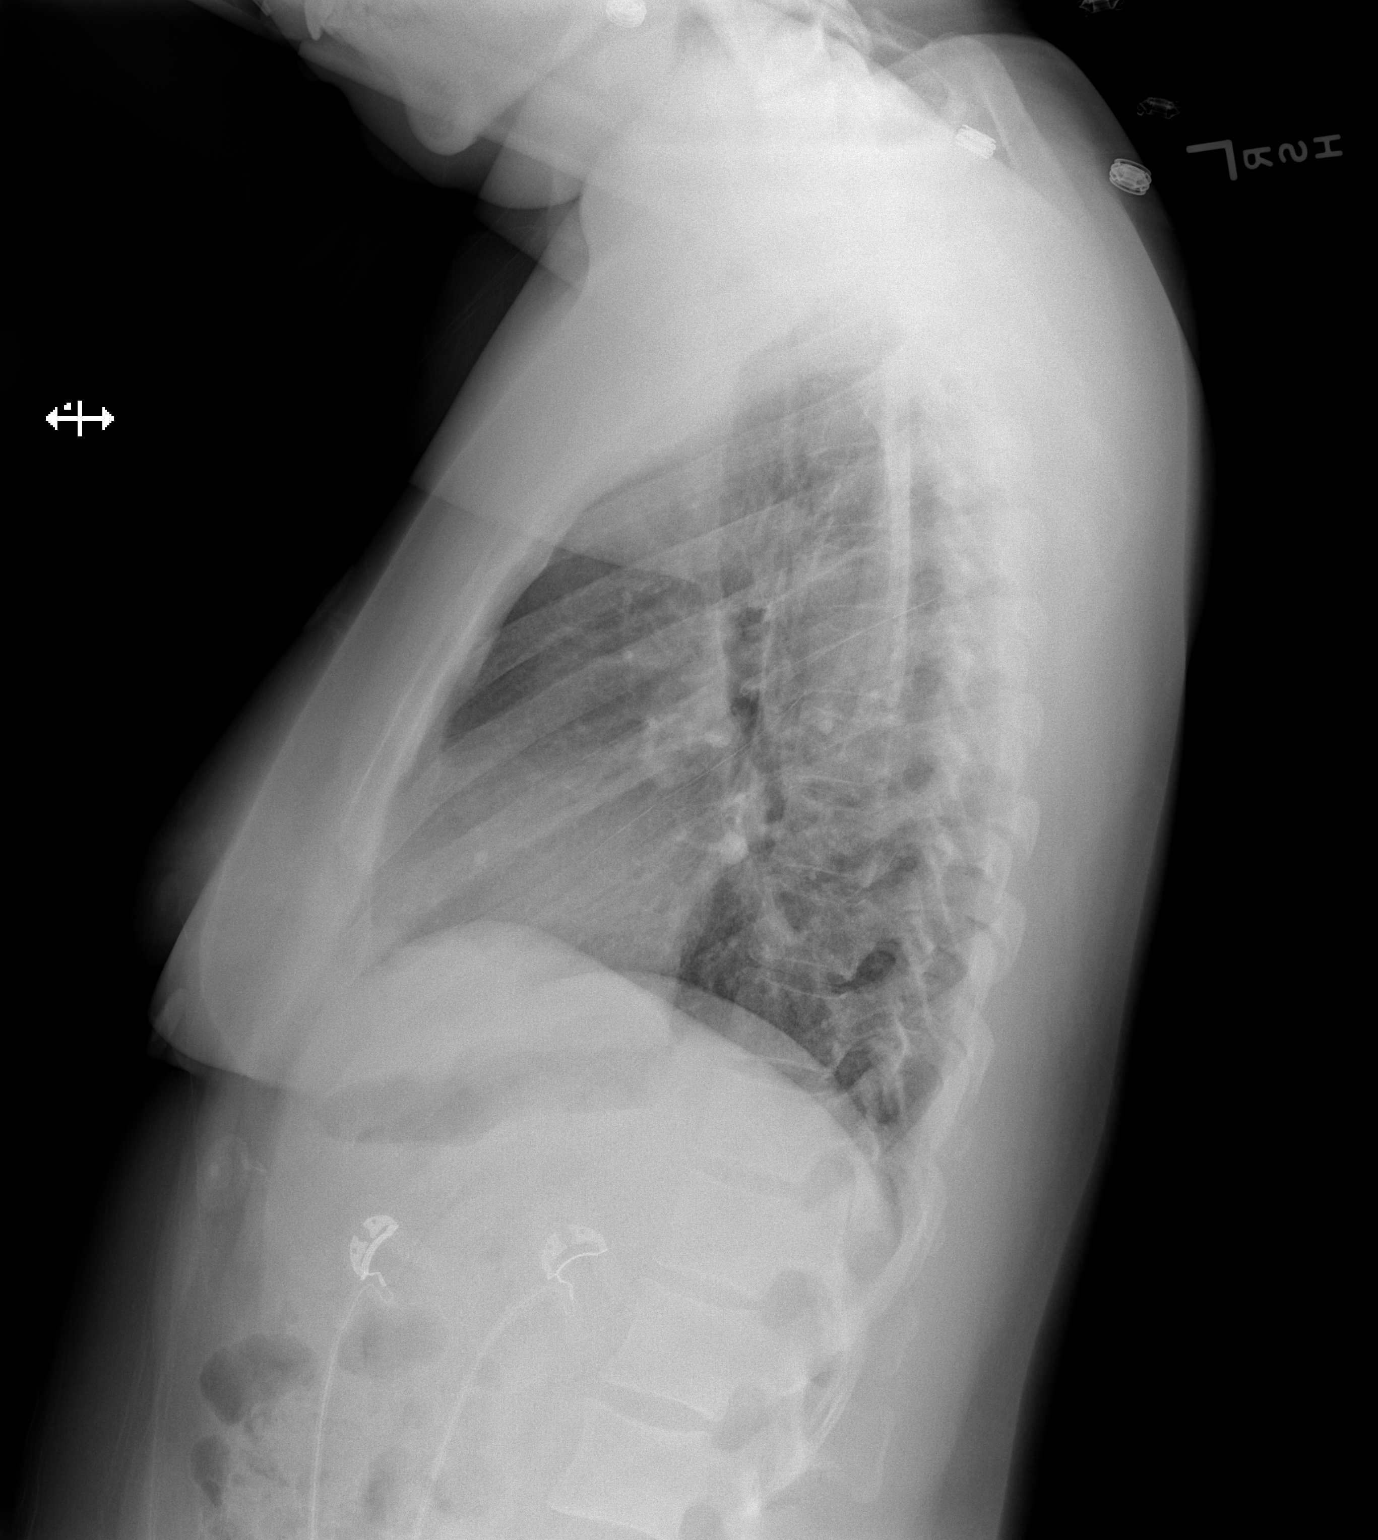

[2 of 2 positions shown; findings below may reference images not displayed]

FINDINGS: The heart size and mediastinal contours are within normal limits.
Both lungs are clear. The visualized skeletal structures are
unremarkable.
IMPRESSION: No active cardiopulmonary disease.

## 2019-04-10 ENCOUNTER — Ambulatory Visit (HOSPITAL_COMMUNITY)
Admission: EM | Admit: 2019-04-10 | Discharge: 2019-04-10 | Disposition: A | Discharge status: Home / Self Care | Payer: Medicare Other | Attending: Family Medicine | Admitting: Family Medicine

## 2019-04-10 ENCOUNTER — Encounter (HOSPITAL_COMMUNITY): Payer: Self-pay

## 2019-04-10 ENCOUNTER — Other Ambulatory Visit: Payer: Self-pay

## 2019-04-10 DIAGNOSIS — R1031 Right lower quadrant pain: Secondary | ICD-10-CM | POA: Insufficient documentation

## 2019-04-10 LAB — POCT URINALYSIS DIP (DEVICE)
Glucose, UA: 100 mg/dL — AB
Ketones, ur: NEGATIVE mg/dL
Leukocytes,Ua: NEGATIVE
Nitrite: NEGATIVE
Protein, ur: 100 mg/dL — AB
Specific Gravity, Urine: >=1.03 (ref 1.005–1.030)
Urobilinogen, UA: 1 mg/dL (ref 0.0–1.0)
pH: 6 (ref 5.0–8.0)

## 2019-04-10 MED ORDER — TETANUS-DIPHTH-ACELL PERTUSSIS 5-2.5-18.5 LF-MCG/0.5 IM SUSP
INTRAMUSCULAR | Status: AC
  Filled 2019-04-10: qty 0.5

## 2019-04-10 MED ORDER — NAPROXEN 500 MG PO TABS: 500.0000 mg | ORAL_TABLET | Freq: Two times a day (BID) | ORAL | 0 refills | Status: AC

## 2019-04-10 MED ORDER — TAMSULOSIN HCL 0.4 MG PO CAPS: 0.4000 mg | ORAL_CAPSULE | Freq: Every day | ORAL | 0 refills | Status: AC

## 2019-04-10 NOTE — ED Provider Notes (Signed)
[facility]    CSN: [id number] Arrival date & time: 04/10/19  1513      History   Chief Complaint Chief Complaint  Patient presents with  . Abdominal Pain    Needs Sign language    HPI Susan Ayers is a 49 y.o. female presenting today for evaluation of right side pain.  Patient states that over the past week she has had right-sided lower abdominal pain as well as into her right lower back.  She originally thought this was related to menstrual cramping as she ended her menstrual cycle yesterday.  Menstrual cramping typically only lasts 1 to 2 days.  Length of cycle and heaviness was normal.  She has noticed some cloudy urine and change in color of the urine.  Denies dysuria.  Does report a sensation of incomplete voiding.  Denies nausea or vomiting, does have decreased appetite.  Denies fevers but has had a cough and cold chills.  Has had 1 prior UTI.  Does report prior history of kidney stone.   HPI  History reviewed. No pertinent past medical history.  Patient Active Problem List   Diagnosis Date Noted  . ASCUS PAP 06/18/2008  . VAGINITIS 09/26/2007  . DEPRESSIVE DISORDER 03/21/2007  . ANEMIA, OTHER UNSPEC 11/15/2006  . HEARING LOSS NOS OR DEAFNESS 04/12/2006  . SYNCOPE 04/12/2006    History reviewed. No pertinent surgical history.  OB History   No obstetric history on file.      Home Medications    Prior to Admission medications   Medication Sig Start Date End Date Taking? Authorizing Provider  lisinopril-hydrochlorothiazide (PRINZIDE,ZESTORETIC) 20-12.5 MG tablet Take 1 tablet by mouth daily. 07/28/15  Yes [provider]  atorvastatin (LIPITOR) 10 MG tablet Take 10 mg by mouth daily. 05/09/15 04/10/19 Yes [provider]  calcium carbonate (TUMS - DOSED IN MG ELEMENTAL CALCIUM) 500 MG chewable tablet Chew 1 tablet by mouth daily as needed for indigestion or heartburn.    [provider]  naproxen (NAPROSYN) 500 MG tablet Take 1  tablet (500 mg total) by mouth 2 (two) times daily. 04/10/19   Kenleigh Toback C, PA-C  naproxen sodium (ANAPROX) 220 MG tablet Take 220 mg by mouth 2 (two) times daily as needed (pain).    [provider]  omeprazole (PRILOSEC) 20 MG capsule Take 1 capsule (20 mg total) by mouth daily. 08/11/15   Linwood Dibbles, MD  tamsulosin (FLOMAX) 0.4 MG CAPS capsule Take 1 capsule (0.4 mg total) by mouth daily. 04/10/19   Catherin Doorn, Junius Creamer, PA-C    Family History Family History  Family history unknown: Yes    Social History Social History   Tobacco Use  . Smoking status: Never Smoker  . Smokeless tobacco: Never Used  Substance Use Topics  . Alcohol use: No  . Drug use: No     Allergies   Patient has no known allergies.   Review of Systems Review of Systems  Constitutional: Negative for fever.  Respiratory: Negative for shortness of breath.   Cardiovascular: Negative for chest pain.  Gastrointestinal: Positive for abdominal pain. Negative for diarrhea, nausea and vomiting.  Genitourinary: Negative for dysuria, flank pain, genital sores, hematuria, menstrual problem, vaginal bleeding, vaginal discharge and vaginal pain.  Musculoskeletal: Negative for back pain.  Skin: Negative for rash.  Neurological: Negative for dizziness, light-headedness and headaches.     Physical Exam Triage Vital Signs ED Triage Vitals  Enc Vitals Group     BP 04/10/19 1607 Marland Kitchen)  142/95     Pulse Rate 04/10/19 1607 94     Resp 04/10/19 1607 16     Temp 04/10/19 1607 99 F (37.2 C)     Temp Source 04/10/19 1607 Oral     SpO2 04/10/19 1607 100 %     Weight --      Height --      Head Circumference --      Peak Flow --      Pain Score 04/10/19 1605 10     Pain Loc --      Pain Edu? --      Excl. in [place]? --    No data found.  Updated Vital Signs BP (!) 142/95 (BP Location: Right Arm)   Pulse 94   Temp 99 F (37.2 C) (Oral)   Resp 16   SpO2 100%   Visual Acuity Right Eye Distance:   Left  Eye Distance:   Bilateral Distance:    Right Eye Near:   Left Eye Near:    Bilateral Near:     Physical Exam Vitals and nursing note reviewed.  Constitutional:      Appearance: She is well-developed.     Comments: No acute distress  HENT:     Head: Normocephalic and atraumatic.     Nose: Nose normal.  Eyes:     Conjunctiva/sclera: Conjunctivae normal.  Cardiovascular:     Rate and Rhythm: Normal rate.  Pulmonary:     Effort: Pulmonary effort is normal. No respiratory distress.     Comments: Breathing comfortably at rest, CTABL, no wheezing, rales or other adventitious sounds auscultated Abdominal:     General: There is no distension.     Comments: Abdomen soft, significant tenderness to palpation in right lower quadrant and suprapubic area; negative rebound, negative McBurney's  Musculoskeletal:        General: Normal range of motion.     Cervical back: Neck supple.     Comments: Right lower lumbar area tender to palpation  Skin:    General: Skin is warm and dry.  Neurological:     Mental Status: She is alert and oriented to person, place, and time.      UC Treatments / Results  Labs (all labs ordered are listed, but only abnormal results are displayed) Labs Reviewed  POCT URINALYSIS DIP (DEVICE) - Abnormal; Notable for the following components:      Result Value   Glucose, UA 100 (*)    Bilirubin Urine SMALL (*)    Hgb urine dipstick LARGE (*)    Protein, ur 100 (*)    All other components within normal limits  URINE CULTURE    EKG   Radiology No results found.  Procedures Procedures (including critical care time)  Medications Ordered in UC Medications - No data to display  Initial Impression / Assessment and Plan / UC Course  I have reviewed the triage vital signs and the nursing notes.  Pertinent labs & imaging results that were available during my care of the patient were reviewed by me and considered in my medical decision making (see chart for  details).     Negative leuks and nitrites, does have large hemoglobin.  Unclear if this is related to recent cessation of menstrual cycle versus true hematuria, given associated pain concerning for possible stone.  Will treat as such with naproxen for pain, tamsulosin daily as well as urine straining.  Advised to follow-up with PCP if persisting, follow-up in emergency room  if pain worsening, developing nausea or vomiting.  Discussed strict return precautions. Patient verbalized understanding and is agreeable with plan.  Final Clinical Impressions(s) / UC Diagnoses   Final diagnoses:  Right lower quadrant abdominal pain     Discharge Instructions     Urine did not show signs of infection Did have some blood- possible stone  Please use naprosyn twice daily with food Flomax daily to help pass possible stone and try straining urine Please follow up with primary care if persisting, emergency room if worsening   ED Prescriptions    Medication Sig Dispense Auth. Provider   naproxen (NAPROSYN) 500 MG tablet Take 1 tablet (500 mg total) by mouth 2 (two) times daily. 30 tablet Toy Eisemann C, PA-C   tamsulosin (FLOMAX) 0.4 MG CAPS capsule Take 1 capsule (0.4 mg total) by mouth daily. 10 capsule Rashanna Christiana, Flasher C, PA-C     PDMP not reviewed this encounter.   Lew Dawes, [place] 04/10/19 1730

## 2019-04-10 NOTE — Discharge Instructions (Addendum)
Urine did not show signs of infection Did have some blood- possible stone  Please use naprosyn twice daily with food Flomax daily to help pass possible stone and try straining urine Please follow up with primary care if persisting, emergency room if worsening

## 2019-04-10 NOTE — ED Triage Notes (Signed)
Patient presents to Urgent Care with complaints of right lower quadrant abdominal pain near her groin since three days ago. Patient reports her urine seems cloudy but it does not hurt when she urinates.

## 2019-04-11 LAB — URINE CULTURE: Culture: NO GROWTH

## 2020-11-23 ENCOUNTER — Other Ambulatory Visit: Payer: Self-pay

## 2020-11-23 ENCOUNTER — Ambulatory Visit (AMBULATORY_SURGERY_CENTER): Payer: Medicare Other

## 2020-11-23 VITALS — Ht 63.0 in | Wt 207.0 lb

## 2020-11-23 DIAGNOSIS — Z1211 Encounter for screening for malignant neoplasm of colon: Secondary | ICD-10-CM

## 2020-11-23 MED ORDER — PEG 3350-KCL-NA BICARB-NACL 420 G PO SOLR: 4000.0000 mL | Freq: Once | ORAL | 0 refills | Status: AC

## 2020-11-23 NOTE — Progress Notes (Signed)
    Patient's pre-visit was done today over the phone with the patient   Name,DOB and address verified.   Patient denies any allergies to Eggs and Soy.  Patient denies any problems with anesthesia/sedation Patient denies taking diet pills or blood thinners.  Denies atrial flutter or atrial fib Denies chronic constipation No home Oxygen.   Packet of Prep instructions mailed to patient including a copy of a consent form-pt is aware.  Patient understands to call us back with any questions or concerns.  Patient is aware of our care-partner policy and Covid-19 safety protocol.    Sign interpreter:Elaine Ericka Pontiff

## 2020-12-06 ENCOUNTER — Ambulatory Visit (AMBULATORY_SURGERY_CENTER): Payer: Medicare Other | Admitting: Gastroenterology

## 2020-12-06 ENCOUNTER — Encounter: Payer: Self-pay | Admitting: Gastroenterology

## 2020-12-06 ENCOUNTER — Other Ambulatory Visit: Payer: Self-pay

## 2020-12-06 VITALS — BP 100/49 | HR 77 | Temp 97.7°F | Resp 16 | Ht 63.0 in | Wt 207.0 lb

## 2020-12-06 DIAGNOSIS — Z1211 Encounter for screening for malignant neoplasm of colon: Secondary | ICD-10-CM | POA: Diagnosis not present

## 2020-12-06 MED ORDER — SODIUM CHLORIDE 0.9 % IV SOLN: 500.0000 mL | Freq: Once | INTRAVENOUS | Status: DC

## 2020-12-06 NOTE — Patient Instructions (Signed)
Please read handouts provided. ?Continue present medications. ?Repeat colonoscopy in 10 years for screening. ? ? ?YOU HAD AN ENDOSCOPIC PROCEDURE TODAY AT THE [facility]:   Refer to the procedure report that was given to you for any specific questions about what was found during the examination.  If the procedure report does not answer your questions, please call your gastroenterologist to clarify.  If you requested that your care partner not be given the details of your procedure findings, then the procedure report has been included in a sealed envelope for you to review at your convenience later. ? ?YOU SHOULD EXPECT: Some feelings of bloating in the abdomen. Passage of more gas than usual.  Walking can help get rid of the air that was put into your GI tract during the procedure and reduce the bloating. If you had a lower endoscopy (such as a colonoscopy or flexible sigmoidoscopy) you may notice spotting of blood in your stool or on the toilet paper. If you underwent a bowel prep for your procedure, you may not have a normal bowel movement for a few days. ? ?Please Note:  You might notice some irritation and congestion in your nose or some drainage.  This is from the oxygen used during your procedure.  There is no need for concern and it should clear up in a day or so. ? ?SYMPTOMS TO REPORT IMMEDIATELY: ? ?Following lower endoscopy (colonoscopy or flexible sigmoidoscopy): ? Excessive amounts of blood in the stool ? Significant tenderness or worsening of abdominal pains ? Swelling of the abdomen that is new, acute ? Fever of 100?F or higher ? ? ?For urgent or emergent issues, a gastroenterologist can be reached at any hour by calling [phone]. ?Do not use MyChart messaging for urgent concerns.  ? ? ?DIET:  We do recommend a small meal at first, but then you may proceed to your regular diet.  Drink plenty of fluids but you should avoid alcoholic beverages for 24 hours. ? ?ACTIVITY:  You should  plan to take it easy for the rest of today and you should NOT DRIVE or use heavy machinery until tomorrow (because of the sedation medicines used during the test).   ? ?FOLLOW UP: ?Our staff will call the number listed on your records 48-72 hours following your procedure to check on you and address any questions or concerns that you may have regarding the information given to you following your procedure. If we do not reach you, we will leave a message.  We will attempt to reach you two times.  During this call, we will ask if you have developed any symptoms of COVID 19. If you develop any symptoms (ie: fever, flu-like symptoms, shortness of breath, cough etc.) before then, please call [phone].  If you test positive for Covid 19 in the 2 weeks post procedure, please call and report this information to us.   ? ?If any biopsies were taken you will be contacted by phone or by letter within the next 1-3 weeks.  Please call us at [phone] if you have not heard about the biopsies in 3 weeks.  ? ? ?SIGNATURES/CONFIDENTIALITY: ?You and/or your care partner have signed paperwork which will be entered into your electronic medical record.  These signatures attest to the fact that that the information above on your After Visit Summary has been reviewed and is understood.  Full responsibility of the confidentiality of this discharge information lies with you and/or your care-partner.  ?

## 2020-12-06 NOTE — Progress Notes (Signed)
[facility] History and Physical   Primary Care Physician:  Lucila Maine   Reason for Procedure:  Colorectal cancer screening  Plan:    Screening colonoscopy with possible interventions as needed     HPI: Susan Ayers is a very pleasant 50 y.o. female here for screening colonoscopy. Denies any nausea, vomiting, abdominal pain, melena or bright red blood per rectum  The risks and benefits as well as alternatives of endoscopic procedure(s) have been discussed and reviewed. All questions answered. The patient agrees to proceed.    Past Medical History:  Diagnosis Date   GERD (gastroesophageal reflux disease)    Hyperlipidemia    Hypertension     Past Surgical History:  Procedure Laterality Date   BREAST BIOPSY      Prior to Admission medications   Medication Sig Start Date End Date Taking? Authorizing Provider  atorvastatin (LIPITOR) 10 MG tablet Take 10 mg by mouth at bedtime. 10/29/20  Yes [provider]  lisinopril-hydrochlorothiazide (PRINZIDE,ZESTORETIC) 20-12.5 MG tablet Take 1 tablet by mouth daily. 07/28/15  Yes [provider]  calcium carbonate (TUMS - DOSED IN MG ELEMENTAL CALCIUM) 500 MG chewable tablet Chew 1 tablet by mouth daily as needed for indigestion or heartburn. Patient not taking: No sig reported    [provider]  naproxen (NAPROSYN) 500 MG tablet Take 1 tablet (500 mg total) by mouth 2 (two) times daily. Patient not taking: No sig reported 04/10/19   Wieters, Hallie C, PA-C  naproxen sodium (ANAPROX) 220 MG tablet Take 220 mg by mouth 2 (two) times daily as needed (pain). Patient not taking: No sig reported    [provider]  omeprazole (PRILOSEC) 20 MG capsule Take 1 capsule (20 mg total) by mouth daily. Patient not taking: No sig reported 08/11/15   Linwood Dibbles, MD  tamsulosin (FLOMAX) 0.4 MG CAPS capsule Take 1 capsule (0.4 mg total) by mouth daily. Patient not taking: No sig reported 04/10/19    Lew Dawes, PA-C    Current Outpatient Medications  Medication Sig Dispense Refill   atorvastatin (LIPITOR) 10 MG tablet Take 10 mg by mouth at bedtime.     lisinopril-hydrochlorothiazide (PRINZIDE,ZESTORETIC) 20-12.5 MG tablet Take 1 tablet by mouth daily.  1   calcium carbonate (TUMS - DOSED IN MG ELEMENTAL CALCIUM) 500 MG chewable tablet Chew 1 tablet by mouth daily as needed for indigestion or heartburn. (Patient not taking: No sig reported)     naproxen (NAPROSYN) 500 MG tablet Take 1 tablet (500 mg total) by mouth 2 (two) times daily. (Patient not taking: No sig reported) 30 tablet 0   naproxen sodium (ANAPROX) 220 MG tablet Take 220 mg by mouth 2 (two) times daily as needed (pain). (Patient not taking: No sig reported)     omeprazole (PRILOSEC) 20 MG capsule Take 1 capsule (20 mg total) by mouth daily. (Patient not taking: No sig reported) 14 capsule 1   tamsulosin (FLOMAX) 0.4 MG CAPS capsule Take 1 capsule (0.4 mg total) by mouth daily. (Patient not taking: No sig reported) 10 capsule 0   Current Facility-Administered Medications  Medication Dose Route Frequency Provider Last Rate Last Admin   0.9 %  sodium chloride infusion  500 mL Intravenous Once Napoleon Form, MD        Allergies as of 12/06/2020   (No Known Allergies)    Family History  Problem Relation Age of Onset   Colon cancer Neg Hx    Colon polyps Neg  Hx    Esophageal cancer Neg Hx    Rectal cancer Neg Hx    Stomach cancer Neg Hx     Social History   Socioeconomic History   Marital status: Legally Separated    Spouse name: Not on file   Number of children: Not on file   Years of education: Not on file   Highest education level: Not on file  Occupational History   Not on file  Tobacco Use   Smoking status: Never   Smokeless tobacco: Never  Vaping Use   Vaping Use: Never used  Substance and Sexual Activity   Alcohol use: No   Drug use: No   Sexual activity: Not on file  Other Topics  Concern   Not on file  Social History Narrative   Not on file   Social Determinants of Health   Financial Resource Strain: Not on file  Food Insecurity: Not on file  Transportation Needs: Not on file  Physical Activity: Not on file  Stress: Not on file  Social Connections: Not on file  Intimate Partner Violence: Not on file    Review of Systems:  All other review of systems negative except as mentioned in the HPI.  Physical Exam: Vital signs in last 24 hours: BP 140/71   Pulse (!) 109   Temp 97.7 F (36.5 C) (Temporal)   Ht 5\' 3"  (1.6 m)   Wt 207 lb (93.9 kg)   SpO2 99%   BMI 36.67 kg/m     General:   Alert, NAD Lungs:  Clear .   Heart:  Regular rate and rhythm Abdomen:  Soft, nontender and nondistended. Neuro/Psych:  Alert and cooperative. Normal mood and affect. A and O x 3  Reviewed labs, radiology imaging, old records and pertinent past GI work up  Patient is appropriate for planned procedure(s) and anesthesia in an ambulatory setting   K. , MD [phone]

## 2020-12-06 NOTE — Progress Notes (Signed)
Pt Drowsy. VSS. To PACU, report to RN. No anesthetic complications noted.  

## 2020-12-06 NOTE — Progress Notes (Signed)
VS completed by DT.  Interpreter used today at the [facility] for this pt.  Interpreter's name is - Victorino Dike   Pt's states no medical or surgical changes since previsit or office visit.

## 2020-12-06 NOTE — Op Note (Signed)
[facility] Patient Name: Susan Ayers Procedure Date: 12/06/2020 9:16 AM MRN: [id number] Endoscopist: Napoleon Form , MD Age: 50 Referring MD:  Date of Birth: 1970-10-08 Gender: Female Account #: [id number] Procedure:                Colonoscopy Indications:              Screening for colorectal malignant neoplasm Medicines:                Monitored Anesthesia Care Procedure:                Pre-Anesthesia Assessment:                           - Prior to the procedure, a History and Physical                            was performed, and patient medications and                            allergies were reviewed. The patient's tolerance of                            previous anesthesia was also reviewed. The risks                            and benefits of the procedure and the sedation                            options and risks were discussed with the patient.                            All questions were answered, and informed consent                            was obtained. ASA Grade Assessment: II - A patient                            with mild systemic disease. After reviewing the                            risks and benefits, the patient was deemed in                            satisfactory condition to undergo the procedure.                           After obtaining informed consent, the colonoscope                            was passed under direct vision. Throughout the                            procedure, the patient's blood pressure, pulse, and  oxygen saturations were monitored continuously. The                            Olympus PCF-H190DL ([id number]) Colonoscope was                            introduced through the anus and advanced to the the                            cecum, identified by appendiceal orifice and                            ileocecal valve. The colonoscopy was performed                            without  difficulty. The patient tolerated the                            procedure well. The quality of the bowel                            preparation was excellent. The ileocecal valve,                            appendiceal orifice, and rectum were photographed. Scope In: 9:20:10 AM Scope Out: 9:32:47 AM Scope Withdrawal Time: 0 hours 9 minutes 55 seconds  Total Procedure Duration: 0 hours 12 minutes 37 seconds  Findings:                 The perianal and digital rectal examinations were                            normal.                           Non-bleeding internal hemorrhoids were found during                            retroflexion. The hemorrhoids were small.                           The exam was otherwise without abnormality. Complications:            No immediate complications. Estimated Blood Loss:     Estimated blood loss was minimal. Impression:               - Non-bleeding internal hemorrhoids.                           - The examination was otherwise normal.                           - No specimens collected. Recommendation:           - Patient has a contact number available for  emergencies. The signs and symptoms of potential                            delayed complications were discussed with the                            patient. Return to normal activities tomorrow.                            Written discharge instructions were provided to the                            patient.                           - Resume previous diet.                           - Continue present medications.                           - Repeat colonoscopy in 10 years for screening                            purposes. Napoleon Form, MD 12/06/2020 9:37:50 AM This report has been signed electronically.

## 2020-12-08 ENCOUNTER — Telehealth: Payer: Self-pay | Admitting: *Deleted

## 2020-12-08 NOTE — Telephone Encounter (Signed)
Line busy x3

## 2020-12-08 NOTE — Telephone Encounter (Signed)
Message left via interpreter
